# Patient Record
Sex: Female | Born: 1995 | Race: White | Hispanic: No | Marital: Single | Smoking: Never smoker
Health system: Southern US, Community
[De-identification: ages and names within clinical notes are randomized; demographics above are authoritative.]

## PROBLEM LIST (undated history)

## (undated) DIAGNOSIS — F419 Anxiety disorder, unspecified: Secondary | ICD-10-CM

## (undated) DIAGNOSIS — E739 Lactose intolerance, unspecified: Secondary | ICD-10-CM

## (undated) DIAGNOSIS — F329 Major depressive disorder, single episode, unspecified: Secondary | ICD-10-CM

## (undated) DIAGNOSIS — F32A Depression, unspecified: Secondary | ICD-10-CM

## (undated) HISTORY — DX: Major depressive disorder, single episode, unspecified: F32.9

## (undated) HISTORY — DX: Anxiety disorder, unspecified: F41.9

## (undated) HISTORY — DX: Depression, unspecified: F32.A

---
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
## 2016-02-28 ENCOUNTER — Emergency Department (HOSPITAL_COMMUNITY)
Admission: EM | Admit: 2016-02-28 | Discharge: 2016-02-28 | Disposition: A | Discharge status: Home / Self Care | Payer: BLUE CROSS/BLUE SHIELD | Attending: Emergency Medicine | Admitting: Emergency Medicine

## 2016-02-28 ENCOUNTER — Encounter (HOSPITAL_COMMUNITY): Payer: Self-pay | Admitting: *Deleted

## 2016-02-28 DIAGNOSIS — N898 Other specified noninflammatory disorders of vagina: Secondary | ICD-10-CM | POA: Diagnosis present

## 2016-02-28 DIAGNOSIS — N760 Acute vaginitis: Secondary | ICD-10-CM | POA: Diagnosis not present

## 2016-02-28 DIAGNOSIS — B9689 Other specified bacterial agents as the cause of diseases classified elsewhere: Secondary | ICD-10-CM

## 2016-02-28 LAB — URINALYSIS, COMPLETE (UACMP) WITH MICROSCOPIC
Bacteria, UA: NONE SEEN
Bilirubin Urine: NEGATIVE
Glucose, UA: NEGATIVE mg/dL
Ketones, ur: NEGATIVE mg/dL
Nitrite: NEGATIVE
Protein, ur: NEGATIVE mg/dL
Specific Gravity, Urine: 1.004 — ABNORMAL LOW (ref 1.005–1.030)
pH: 7 (ref 5.0–8.0)

## 2016-02-28 LAB — WET PREP, GENITAL
Sperm: NONE SEEN
Trich, Wet Prep: NONE SEEN
Yeast Wet Prep HPF POC: NONE SEEN

## 2016-02-28 LAB — PREGNANCY, URINE: Preg Test, Ur: NEGATIVE

## 2016-02-28 MED ORDER — METRONIDAZOLE 500 MG PO TABS: 500.0000 mg | ORAL_TABLET | Freq: Two times a day (BID) | ORAL | 0 refills | Status: DC

## 2016-02-28 NOTE — ED Triage Notes (Signed)
Pt reports stabbing lower abdominal pain and "whitish red" vaginal discharge for about 1 week. No meds PTA. Denies urinary symptoms or fevers.

## 2016-02-28 NOTE — ED Provider Notes (Signed)
[facility]-EMERGENCY DEPT Provider Note   CSN: [id number] Arrival date & time: 02/28/16  1709  By signing my name below, I, Rosario Adie, attest that this documentation has been prepared under the direction and in the presence of Raeford Razor, MD. Electronically Signed: Rosario Adie, ED Scribe. 02/28/16. 5:26 PM.  History   Chief Complaint Chief Complaint  Patient presents with  . Vaginal Discharge   The history is provided by the patient. No language interpreter was used.    HPI Comments: Sheila Bradley is a 21 y.o. female with no pertinent PMHx, who presents to the Emergency Department complaining of intermittent, sharp suprapubic abdominal pain beginning two weeks ago. No pain while in the ED. She notes associated whitish-bloody vaginal discharge with an abnormal odor, lower back pain, and intermittent dysuria as well. Pt has not noticed any precipitating or exacerbating factors to her pain. No treatments for her symptoms were tried prior to coming into the ED. She is currently sexually active, and her last STD screening was benign. LNMP: ~1 month ago, and she is currently on once every three month injections for birth control. She has a PSHx to the abdomen including an appendectomy. Pt denies fever, vaginal pain, frequency, urgency, nausea, vomiting, or any other associated symptoms.   History reviewed. No pertinent past medical history.  There are no active problems to display for this patient.  Past Surgical History:  Procedure Laterality Date  . APPENDECTOMY     OB History    No data available     Home Medications    Prior to Admission medications   Not on File   Family History No family history on file.  Social History Social History  Substance Use Topics  . Smoking status: Never Smoker  . Smokeless tobacco: Never Used  . Alcohol use Yes   Allergies   Patient has no known allergies.  Review of Systems Review of Systems  Constitutional: Negative  for fever.  Gastrointestinal: Positive for abdominal pain. Negative for nausea and vomiting.  Genitourinary: Positive for dysuria, vaginal bleeding and vaginal discharge.  Musculoskeletal: Positive for back pain (lower).  All other systems reviewed and are negative.  Physical Exam Updated Vital Signs BP 134/83   Pulse 70   Temp 98.3 F (36.8 C)   Resp 16   LMP 01/28/2016 (Approximate)   SpO2 100%   Physical Exam  Constitutional: She appears well-developed and well-nourished.  HENT:  Head: Normocephalic.  Right Ear: External ear normal.  Left Ear: External ear normal.  Nose: Nose normal.  Mouth/Throat: Oropharynx is clear and moist.  Eyes: Conjunctivae are normal. Right eye exhibits no discharge. Left eye exhibits no discharge.  Neck: Normal range of motion.  Cardiovascular: Normal rate, regular rhythm and normal heart sounds.   No murmur heard. Pulmonary/Chest: Effort normal and breath sounds normal. No respiratory distress. She has no wheezes. She has no rales.  Abdominal: Soft. She exhibits no distension. There is tenderness. There is no rebound and no guarding.  Minimal suprapubic tenderness.  Genitourinary:  Genitourinary Comments: Chaperone present throughout entire exam. Normal external female genitalia. No concerning lesions noted. Mild to moderate thin white/grey discharge. Cervix normal in appearance.   Musculoskeletal: Normal range of motion. She exhibits no edema or tenderness.  Neurological: She is alert. No cranial nerve deficit. Coordination normal.  Skin: Skin is warm and dry. No rash noted. No erythema. No pallor.  Psychiatric: She has a normal mood and affect. Her behavior is normal.  Nursing  note and vitals reviewed.  ED Treatments / Results  DIAGNOSTIC STUDIES: Oxygen Saturation is 100% on RA, normal by my interpretation.   COORDINATION OF CARE: 5:22 PM-Discussed next steps with pt. Pt verbalized understanding and is agreeable with the plan.    Labs (all labs ordered are listed, but only abnormal results are displayed) Labs Reviewed - No data to display  EKG  EKG Interpretation None      Radiology No results found.  Procedures Procedures   Medications Ordered in ED Medications - No data to display  Initial Impression / Assessment and Plan / ED Course  I have reviewed the triage vital signs and the nursing notes.  Pertinent labs & imaging results that were available during my care of the patient were reviewed by me and considered in my medical decision making (see chart for details).      Final Clinical Impressions(s) / ED Diagnoses   Final diagnoses:  BV (bacterial vaginosis)   New Prescriptions New Prescriptions   No medications on file   I personally preformed the services scribed in my presence. The recorded information has been reviewed is accurate. Raeford RazorStephen Avrohom Mckelvin, MD.     Raeford RazorStephen Yasmin Dibello, MD 03/13/16 [id number]

## 2016-02-29 LAB — GC/CHLAMYDIA PROBE AMP (~~LOC~~) NOT AT ARMC
Chlamydia: NEGATIVE
Neisseria Gonorrhea: NEGATIVE

## 2016-02-29 LAB — RPR: RPR Ser Ql: NONREACTIVE

## 2016-04-08 ENCOUNTER — Emergency Department (HOSPITAL_COMMUNITY)
Admission: EM | Admit: 2016-04-08 | Discharge: 2016-04-08 | Disposition: A | Discharge status: Home / Self Care | Payer: BLUE CROSS/BLUE SHIELD | Attending: Emergency Medicine | Admitting: Emergency Medicine

## 2016-04-08 ENCOUNTER — Emergency Department (HOSPITAL_COMMUNITY): Payer: BLUE CROSS/BLUE SHIELD

## 2016-04-08 ENCOUNTER — Encounter: Payer: Self-pay | Admitting: Emergency Medicine

## 2016-04-08 DIAGNOSIS — M546 Pain in thoracic spine: Secondary | ICD-10-CM | POA: Diagnosis present

## 2016-04-08 DIAGNOSIS — Y939 Activity, unspecified: Secondary | ICD-10-CM | POA: Insufficient documentation

## 2016-04-08 DIAGNOSIS — Z79899 Other long term (current) drug therapy: Secondary | ICD-10-CM | POA: Diagnosis not present

## 2016-04-08 DIAGNOSIS — W208XXA Other cause of strike by thrown, projected or falling object, initial encounter: Secondary | ICD-10-CM | POA: Diagnosis not present

## 2016-04-08 DIAGNOSIS — Y999 Unspecified external cause status: Secondary | ICD-10-CM | POA: Diagnosis not present

## 2016-04-08 DIAGNOSIS — Y929 Unspecified place or not applicable: Secondary | ICD-10-CM | POA: Diagnosis not present

## 2016-04-08 MED ORDER — NAPROXEN 500 MG PO TABS: 500.0000 mg | ORAL_TABLET | Freq: Two times a day (BID) | ORAL | 0 refills | Status: DC

## 2016-04-08 MED ORDER — HYDROCODONE-ACETAMINOPHEN 5-325 MG PO TABS
1.0000 | ORAL_TABLET | Freq: Once | ORAL | Status: AC
  Administered 2016-04-08: 1 via ORAL
  Filled 2016-04-08: qty 1

## 2016-04-08 MED ORDER — METHOCARBAMOL 500 MG PO TABS: 500.0000 mg | ORAL_TABLET | Freq: Every evening | ORAL | 0 refills | Status: AC | PRN

## 2016-04-08 NOTE — ED Triage Notes (Signed)
Pt complains of upper back pain that makes breathing difficult since a clothing hamper fell on her upper back. Pt states she initially had tingling in her right arm that is no longer present.

## 2016-04-08 NOTE — Discharge Instructions (Signed)
Naproxen for pain. Follow up with your primary care provider. Return to the ED if symptoms worsen or you experience any new concerning symptoms.

## 2016-04-08 NOTE — ED Provider Notes (Signed)
[facility]-EMERGENCY DEPT Provider Note   CSN: [id number] Arrival date & time: 04/08/16  1609   By signing my name below, I, Sheila Bradley, attest that this documentation has been prepared under the direction and in the presence of Mathews Robinsons, PA-C Electronically Signed: Soijett Bradley, ED Scribe. 04/08/16. 6:21 PM.  History   Chief Complaint Chief Complaint  Patient presents with  . Back Pain    HPI Sheila Bradley is a 21 y.o. female who presents to the Emergency Department complaining of mid back pain onset last night. Pt reports associated resolved right arm tingling. Pt has tried ibuprofen with her last dose being at 2 PM with no relief of her symptoms. She reports that a full clothing hamper fell from an elevated position onto her mid back. Pt states that her mid back pain is worsened with deep breathing, laying on her stomach, and tilting her head forward. Pt denies color change, wound, fever, chills, and any other symptoms. 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     The history is provided by the patient. No language interpreter was used.    No past medical history on file.  There are no active problems to display for this patient.   Past Surgical History:  Procedure Laterality Date  . APPENDECTOMY      OB History    No data available       Home Medications    Prior to Admission medications   Medication Sig Start Date End Date Taking? Authorizing Provider  ASHLYNA 0.15-0.03 &0.01 MG tablet Take 1 tablet by mouth daily. 01/15/16   Historical Provider, MD  ibuprofen (ADVIL,MOTRIN) 200 MG tablet Take 600 mg by mouth every 6 (six) hours as needed.    Historical Provider, MD  methocarbamol (ROBAXIN) 500 MG tablet Take 1 tablet (500 mg total) by mouth at bedtime as needed for muscle spasms. 04/08/16   Georgiana Shore, PA-C  metroNIDAZOLE (FLAGYL) 500 MG tablet Take 1 tablet (500 mg total) by mouth 2 (two) times daily. 02/28/16    Raeford Razor, MD  naproxen (NAPROSYN) 500 MG tablet Take 1 tablet (500 mg total) by mouth 2 (two) times daily with a meal. 04/08/16   Georgiana Shore, PA-C    Family History No family history on file.  Social History Social History  Substance Use Topics  . Smoking status: Never Smoker  . Smokeless tobacco: Never Used  . Alcohol use Yes     Allergies   Patient has no known allergies.   Review of Systems Review of Systems  Constitutional: Negative for chills and fever.  Respiratory: Negative for chest tightness, shortness of breath, wheezing and stridor.   Cardiovascular: Negative for chest pain, palpitations and leg swelling.  Gastrointestinal: Negative for abdominal pain, nausea and vomiting.  Musculoskeletal: Positive for back pain (mid). Negative for gait problem, neck pain and neck stiffness.  Skin: Negative for color change, pallor, rash and wound.  Neurological: Negative for dizziness, syncope, weakness, light-headedness, numbness and headaches.       +Resolved right arm tingling     Physical Exam Updated Vital Signs BP 125/90 (BP Location: Left Arm)   Pulse 80   Temp 97.8 F (36.6 C) (Oral)   Resp 18   Wt 58.5 kg   LMP 04/08/2016   SpO2 100%   Physical Exam  Constitutional: She is oriented to person, place, and time. She appears well-developed and well-nourished. No distress.  Patient is afebrile, non-toxic  appearing, seating comfortably in chair in no acute distress.  HENT:  Head: Normocephalic and atraumatic.  Eyes: EOM are normal.  Neck: Neck supple.  Cardiovascular: Normal rate, regular rhythm and normal heart sounds.  Exam reveals no gallop and no friction rub.   No murmur heard. Pulmonary/Chest: Effort normal and breath sounds normal. No respiratory distress. She has no wheezes. She has no rales.  Good air exchange.  Abdominal: She exhibits no distension.  Musculoskeletal: Normal range of motion.       Cervical back: She exhibits no tenderness  and no bony tenderness.       Thoracic back: She exhibits bony tenderness.       Lumbar back: She exhibits no tenderness and no bony tenderness.  Midline tenderness of thoracic spine. No midline cervical or lumbar tenderness.  Neurological: She is alert and oriented to person, place, and time.  Skin: Skin is warm and dry.  Psychiatric: She has a normal mood and affect. Her behavior is normal.  Nursing note and vitals reviewed.    ED Treatments / Results  DIAGNOSTIC STUDIES: Oxygen Saturation is 100% on RA, nl by my interpretation.    COORDINATION OF CARE: 6:18 PM Discussed treatment plan with pt at bedside which includes thoracic spine xray, norco, ice, and pt agreed to plan.   Radiology Dg Thoracic Spine 2 View  Result Date: 04/08/2016 CLINICAL DATA:  Back pain after a blunt object fell on the patient. EXAM: THORACIC SPINE 2 VIEWS COMPARISON:  None. FINDINGS: There is no evidence of thoracic spine fracture. Alignment is normal. No other significant bone abnormalities are identified. IMPRESSION: Negative. Electronically Signed   By: Elsie Stain M.D.   On: 04/08/2016 18:50    Procedures Procedures (including critical care time)  Medications Ordered in ED Medications  HYDROcodone-acetaminophen (NORCO/VICODIN) 5-325 MG per tablet 1 tablet (1 tablet Oral Given 04/08/16 1822)     Initial Impression / Assessment and Plan / ED Course  I have reviewed the triage vital signs and the nursing notes.  Pertinent imaging results that were available during my care of the patient were reviewed by me and considered in my medical decision making (see chart for details).     Patient with back pain after injury from hamper falling on her back. No neurological deficits and normal gross neuro exam. Patient is ambulatory. No loss of bowel or bladder control. No concern for cauda equina. No fever, night sweats, weight loss, h/o cancer, IVDA, no recent procedure to back. No urinary symptoms  suggestive of UTI. Xray negative for obvious fracture or dislocation. Supportive care and return precaution discussed. Appears safe for discharge at this time. Follow up as indicated in discharge paperwork.   Discussed strict return precautions and advised to return to the emergency department if experiencing any new or worsening symptoms. Instructions were understood and patient agreed with discharge plan.  Final Clinical Impressions(s) / ED Diagnoses   Final diagnoses:  Acute bilateral thoracic back pain    New Prescriptions New Prescriptions   METHOCARBAMOL (ROBAXIN) 500 MG TABLET    Take 1 tablet (500 mg total) by mouth at bedtime as needed for muscle spasms.   NAPROXEN (NAPROSYN) 500 MG TABLET    Take 1 tablet (500 mg total) by mouth 2 (two) times daily with a meal.   I personally performed the services described in this documentation, which was scribed in my presence. The recorded information has been reviewed and is accurate.     Georgiana Shore,  PA-C 04/08/16 1926    Lavera Guiseana Duo Liu, MD 04/11/16 [phone]

## 2016-10-01 ENCOUNTER — Emergency Department (HOSPITAL_COMMUNITY): Payer: Commercial Managed Care - PPO

## 2016-10-01 ENCOUNTER — Emergency Department (HOSPITAL_COMMUNITY)
Admission: EM | Admit: 2016-10-01 | Discharge: 2016-10-01 | Disposition: A | Discharge status: Home / Self Care | Payer: Commercial Managed Care - PPO | Attending: Emergency Medicine | Admitting: Emergency Medicine

## 2016-10-01 ENCOUNTER — Encounter (HOSPITAL_COMMUNITY): Payer: Self-pay | Admitting: Emergency Medicine

## 2016-10-01 DIAGNOSIS — K5900 Constipation, unspecified: Secondary | ICD-10-CM | POA: Diagnosis not present

## 2016-10-01 DIAGNOSIS — R1032 Left lower quadrant pain: Secondary | ICD-10-CM | POA: Diagnosis not present

## 2016-10-01 DIAGNOSIS — R109 Unspecified abdominal pain: Secondary | ICD-10-CM | POA: Diagnosis present

## 2016-10-01 DIAGNOSIS — Z79899 Other long term (current) drug therapy: Secondary | ICD-10-CM | POA: Diagnosis not present

## 2016-10-01 HISTORY — DX: Lactose intolerance, unspecified: E73.9

## 2016-10-01 LAB — URINALYSIS, ROUTINE W REFLEX MICROSCOPIC
BACTERIA UA: NONE SEEN
Bilirubin Urine: NEGATIVE
GLUCOSE, UA: NEGATIVE mg/dL
KETONES UR: NEGATIVE mg/dL
NITRITE: NEGATIVE
PROTEIN: NEGATIVE mg/dL
Specific Gravity, Urine: 1.043 — ABNORMAL HIGH (ref 1.005–1.030)
pH: 5 (ref 5.0–8.0)

## 2016-10-01 LAB — CBC
HEMATOCRIT: 38.9 % (ref 36.0–46.0)
HEMOGLOBIN: 13.2 g/dL (ref 12.0–15.0)
MCH: 30.8 pg (ref 26.0–34.0)
MCHC: 33.9 g/dL (ref 30.0–36.0)
MCV: 90.9 fL (ref 78.0–100.0)
Platelets: 277 10*3/uL (ref 150–400)
RBC: 4.28 MIL/uL (ref 3.87–5.11)
RDW: 13.3 % (ref 11.5–15.5)
WBC: 10.5 10*3/uL (ref 4.0–10.5)

## 2016-10-01 LAB — LIPASE, BLOOD: LIPASE: 32 U/L (ref 11–51)

## 2016-10-01 LAB — COMPREHENSIVE METABOLIC PANEL
ALBUMIN: 4.4 g/dL (ref 3.5–5.0)
ALT: 28 U/L (ref 14–54)
AST: 23 U/L (ref 15–41)
Alkaline Phosphatase: 44 U/L (ref 38–126)
Anion gap: 6 (ref 5–15)
BUN: 5 mg/dL — ABNORMAL LOW (ref 6–20)
CALCIUM: 9.5 mg/dL (ref 8.9–10.3)
CHLORIDE: 107 mmol/L (ref 101–111)
CO2: 25 mmol/L (ref 22–32)
Creatinine, Ser: 1.02 mg/dL — ABNORMAL HIGH (ref 0.44–1.00)
GFR calc non Af Amer: >60 mL/min (ref >60)
GLUCOSE: 93 mg/dL (ref 65–99)
POTASSIUM: 4.2 mmol/L (ref 3.5–5.1)
SODIUM: 138 mmol/L (ref 135–145)
Total Bilirubin: 0.6 mg/dL (ref 0.3–1.2)
Total Protein: 7.4 g/dL (ref 6.5–8.1)

## 2016-10-01 LAB — I-STAT BETA HCG BLOOD, ED (MC, WL, AP ONLY)

## 2016-10-01 MED ORDER — DICYCLOMINE HCL 10 MG/ML IM SOLN
20.0000 mg | Freq: Once | INTRAMUSCULAR | Status: AC
  Administered 2016-10-01: 20 mg via INTRAMUSCULAR
  Filled 2016-10-01: qty 2

## 2016-10-01 MED ORDER — BISACODYL 5 MG PO TBEC: 5.0000 mg | DELAYED_RELEASE_TABLET | Freq: Every day | ORAL | 0 refills | Status: AC

## 2016-10-01 MED ORDER — SODIUM CHLORIDE 0.9 % IV BOLUS (SEPSIS)
1000.0000 mL | Freq: Once | INTRAVENOUS | Status: AC
  Administered 2016-10-01: 1000 mL via INTRAVENOUS

## 2016-10-01 MED ORDER — IOPAMIDOL (ISOVUE-300) INJECTION 61%
INTRAVENOUS | Status: AC
  Administered 2016-10-01: 18:00:00
  Filled 2016-10-01: qty 100

## 2016-10-01 MED ORDER — DOCUSATE SODIUM 250 MG PO CAPS: 250.0000 mg | ORAL_CAPSULE | Freq: Every day | ORAL | 0 refills | Status: AC

## 2016-10-01 NOTE — ED Triage Notes (Signed)
Pt c/o lower abdominal pain x 3 days. Denies n/v/diarrhea. Denies urinary symptoms, states she has had "off and on" vaginal discharge. LMP 09/16/16.

## 2016-10-01 NOTE — Discharge Instructions (Signed)
As discussed, your evaluation today has been largely reassuring.  But, it is important that you monitor your condition carefully, and do not hesitate to return to the ED if you develop new, or concerning changes in your condition. ? ?Otherwise, please follow-up with your physician for appropriate ongoing care. ? ?

## 2016-10-01 NOTE — ED Provider Notes (Signed)
[facility]-EMERGENCY DEPT Provider Note   CSN: [id number] Arrival date & time: 10/01/16  1446     History   Chief Complaint Chief Complaint  Patient presents with  . Abdominal Pain    HPI Sheila Bradley is a 21 y.o. female.  HPI  Patient presents with concern of abdominal pain. Patient states that she is generally well, aside from history of lactose intolerance which she self diagnosed. She notes that yesterday she has had focal pain persistently in the left lateral abdomen, with occasional bursts in the epigastric area. In general patient has had episodes of similar pain over the past few months, but nothing has sustained, severe and persistent. No relief with anything including OTC medication. There is associated anorexia, nausea, anxiousness There is some bloody stool. Patient states that she has a family members with GI illness, though she is unsure of the diagnoses. Patient is here with her boyfriend who corroborates history.  Past Medical History:  Diagnosis Date  . Lactose intolerance     There are no active problems to display for this patient.   Past Surgical History:  Procedure Laterality Date  . APPENDECTOMY      OB History    No data available       Home Medications    Prior to Admission medications   Medication Sig Start Date End Date Taking? Authorizing Provider  ASHLYNA 0.15-0.03 &0.01 MG tablet Take 1 tablet by mouth daily. 01/15/16   [provider]  ibuprofen (ADVIL,MOTRIN) 200 MG tablet Take 600 mg by mouth every 6 (six) hours as needed.    [provider]  methocarbamol (ROBAXIN) 500 MG tablet Take 1 tablet (500 mg total) by mouth at bedtime as needed for muscle spasms. 04/08/16   Georgiana Shore, PA-C  metroNIDAZOLE (FLAGYL) 500 MG tablet Take 1 tablet (500 mg total) by mouth 2 (two) times daily. 02/28/16   Raeford Razor, MD  naproxen (NAPROSYN) 500 MG tablet Take 1 tablet (500 mg total) by mouth 2 (two) times daily with a  meal. 04/08/16   Georgiana Shore, PA-C    Family History No family history on file.  Social History Social History  Substance Use Topics  . Smoking status: Never Smoker  . Smokeless tobacco: Never Used  . Alcohol use Yes     Allergies   Patient has no known allergies.   Review of Systems Review of Systems  Constitutional:       Per HPI, otherwise negative  HENT:       Per HPI, otherwise negative  Respiratory:       Per HPI, otherwise negative  Cardiovascular:       Per HPI, otherwise negative  Gastrointestinal: Positive for abdominal distention (yesterday), abdominal pain, blood in stool, nausea and rectal pain. Negative for vomiting.  Endocrine:       Negative aside from HPI  Genitourinary:       Neg aside from HPI   Musculoskeletal:       Per HPI, otherwise negative  Skin: Negative.   Neurological: Negative for syncope.     Physical Exam Updated Vital Signs BP 116/86 (BP Location: Left Arm)   Pulse 73   Temp 98.1 F (36.7 C) (Oral)   Resp 16   Ht  (1.575 m)   Wt 59 kg (130 lb)   LMP 09/16/2016   SpO2 100%   BMI 23.78 kg/m   Physical Exam  Constitutional: She is oriented to person, place, and time. She  appears well-developed and well-nourished. No distress.  HENT:  Head: Normocephalic and atraumatic.  Eyes: Conjunctivae and EOM are normal.  Cardiovascular: Normal rate and regular rhythm.   Pulmonary/Chest: Effort normal and breath sounds normal. No stridor. No respiratory distress.  Abdominal: She exhibits no distension. There is tenderness in the epigastric area, periumbilical area and left lower quadrant. There is guarding.  Musculoskeletal: She exhibits no edema.  Neurological: She is alert and oriented to person, place, and time. No cranial nerve deficit.  Skin: Skin is warm and dry.  Psychiatric: She has a normal mood and affect.  Nursing note and vitals reviewed.    ED Treatments / Results  Labs (all labs ordered are listed, but  only abnormal results are displayed) Labs Reviewed  COMPREHENSIVE METABOLIC PANEL - Abnormal; Notable for the following:       Result Value   BUN 5 (*)    Creatinine, Ser 1.02 (*)    All other components within normal limits  LIPASE, BLOOD  CBC  URINALYSIS, ROUTINE W REFLEX MICROSCOPIC  I-STAT BETA HCG BLOOD, ED ([facility], [facility], [facility] ONLY)     Radiology Ct Abdomen Pelvis W Contrast  Result Date: 10/01/2016 CLINICAL DATA:  Lower abdominal pain for 3 days. On and off vaginal discharge. EXAM: CT ABDOMEN AND PELVIS WITH CONTRAST TECHNIQUE: Multidetector CT imaging of the abdomen and pelvis was performed using the standard protocol following bolus administration of intravenous contrast. CONTRAST:  100 cc Isovue 300 intravenous. COMPARISON:  None. FINDINGS: Lower chest:  No contributory findings. Hepatobiliary: No focal liver abnormality.No evidence of biliary obstruction or stone. Pancreas: Unremarkable. Spleen: Unremarkable. Adrenals/Urinary Tract: Negative adrenals. No hydronephrosis or stone. Tiny lower pole cyst on the right, simple appearing on reformats. Unremarkable bladder. Stomach/Bowel: Bowel loops are difficult to separate from one another about the cecum; patient has history of appendectomy per the EMR. No bowel inflammatory changes. Formed stool throughout the colon, possible constipation. Vascular/Lymphatic: No vascular abnormality.  No mass or adenopathy. Reproductive:No pathologic findings. Other: No ascites or pneumoperitoneum. Musculoskeletal: No acute abnormalities. IMPRESSION: 1. No acute finding. 2. Formed stool throughout the colon, please correlate for constipation. Electronically Signed   By: Marnee Spring M.D.   On: 10/01/2016 18:43    Procedures Procedures (including critical care time)  Medications Ordered in ED Medications  sodium chloride 0.9 % bolus 1,000 mL (0 mLs Intravenous Stopped 10/01/16 1918)  dicyclomine (BENTYL) injection 20 mg (20 mg Intramuscular Given 10/01/16  1729)  iopamidol (ISOVUE-300) 61 % injection (  Contrast Given 10/01/16 1800)     Initial Impression / Assessment and Plan / ED Course  I have reviewed the triage vital signs and the nursing notes.  Pertinent labs & imaging results that were available during my care of the patient were reviewed by me and considered in my medical decision making (see chart for details).  7:52 PM Patient states that she feels substantially better after receiving IV fluids, Bentyl. I demonstrated the CT images to the patient and her boyfriend. We reviewed the implications of stool burden, particularly given her discussion of worsening persistent pain.  Patient confirms that she has no urinary complaints she has had urinary tract infection the past, and this would be unusual for her to have infection without symptoms. We discussed the initiation of a new BM regimen, following up with GI, particularly given the patient's acknowledgment of food intolerance, family history of IBS-like symptoms.  Final Clinical Impressions(s) / ED Diagnoses  Abdominal pain   Gerhard Munch,  MD 10/01/16 2000

## 2016-10-01 NOTE — ED Notes (Signed)
Pt aware of need for urine sample.  Will try after fluid bolus.

## 2016-10-01 NOTE — ED Notes (Signed)
Pt back from CT

## 2016-10-01 NOTE — ED Notes (Signed)
Pt ambulatory to [facility] for urine sample

## 2016-10-03 ENCOUNTER — Telehealth: Payer: Self-pay | Admitting: Internal Medicine

## 2016-10-03 NOTE — Telephone Encounter (Signed)
Patient will come in and see Hyacinth Meeker, [place] tomorrow at 2:15

## 2016-10-04 ENCOUNTER — Encounter: Payer: Self-pay | Admitting: Physician Assistant

## 2016-10-04 ENCOUNTER — Other Ambulatory Visit (INDEPENDENT_AMBULATORY_CARE_PROVIDER_SITE_OTHER): Payer: Commercial Managed Care - PPO

## 2016-10-04 ENCOUNTER — Ambulatory Visit (INDEPENDENT_AMBULATORY_CARE_PROVIDER_SITE_OTHER): Payer: Commercial Managed Care - PPO | Admitting: Physician Assistant

## 2016-10-04 VITALS — BP 122/68 | HR 58 | Ht 62.0 in | Wt 127.1 lb

## 2016-10-04 DIAGNOSIS — K581 Irritable bowel syndrome with constipation: Secondary | ICD-10-CM | POA: Diagnosis not present

## 2016-10-04 DIAGNOSIS — R1032 Left lower quadrant pain: Secondary | ICD-10-CM

## 2016-10-04 DIAGNOSIS — K5900 Constipation, unspecified: Secondary | ICD-10-CM

## 2016-10-04 DIAGNOSIS — R1013 Epigastric pain: Secondary | ICD-10-CM

## 2016-10-04 LAB — IGA: IgA: 161 mg/dL (ref 68–378)

## 2016-10-04 MED ORDER — HYOSCYAMINE SULFATE 0.125 MG SL SUBL: 0.1250 mg | SUBLINGUAL_TABLET | SUBLINGUAL | 0 refills | Status: AC | PRN

## 2016-10-04 MED ORDER — OMEPRAZOLE 20 MG PO CPDR: 20.0000 mg | DELAYED_RELEASE_CAPSULE | Freq: Every day | ORAL | 3 refills | Status: AC

## 2016-10-04 NOTE — Patient Instructions (Addendum)
We have sent the following medications to your pharmacy for you to pick up at your convenience: Omeprazole 20 mg daily 30-60 mins before breakfast  Hyoscyamine 0.125 mg every 4-6 hrs as needed  Please purchase the following medications over the counter and take as directed:  Polyethylene Glycol (miralax) 17 g daily   Your provider suggest that you drink more water. Try to have at least 6-8 8 oz glasses of water daily.   We have given you a high fiber diet handout. Please strive to have 25-30 grams of fiber daily.

## 2016-10-04 NOTE — Progress Notes (Signed)
Agree with assessment and plan as outlined.  

## 2016-10-04 NOTE — Progress Notes (Signed)
Chief Complaint: Abdominal pain, constipation  HPI:  Sheila Bradley is a 21 year old Caucasian female with a past medical history of anxiety and depression,  who was referred to me from the ED, for a complaint of abdominal pain and constipation.   Patient was seen in the ED 10/01/16 for a complaint of abdominal pain. At that time, she described a history of lactose intolerance which she had self diagnosed as well as focal pain persistently in the left lateral abdomen with occasional bursts into the epigastric area. Labs at that time including a CMP, lipase, CBC, urinalysis and hCG were all negative/normal. CT of the abdomen and pelvis showed no acute finding. There was formed stool throughout the colon. Patient was placed on a bowel regimen. She did have improvement after IV fluids and Bentyl.   Today, the patient presents to clinic today and describes that she has a family history of "GI illness". She describes that her sister has celiac disease and her father has IBS. He was also diagnosed with precancerous polyps in his late 83s. The patient notes that since being in the ER she has continued with problems. Over the past 3 months she has had trouble with constipation which is almost constant. She notes a bowel movement maybe once a week. This has not improved with a once a day Dulcolax and once a day Colace which she has been using since ER visit. She denies any blood in her stool or black tarry sticky stools. Associated symptoms do include left lower quadrant pain which sometimes "shoots into my epigastrium". Sometimes these pains keep her awake at night and out of school on occasion.   Patient describes waking on a daily basis and will either have pain in her epigastrium or her left lower quadrant or sometimes both. Lately patient has been unable to eat due to the epigastric pain immediately after swallowing. Eating tends to make this pain worse and it does not seem to matter what. She has not had a recent  change in her diet and denies any recent antibiotics or changes in medication. Patient also seems to bloat every other day.   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  Past medical history is positive for being on an anti-anxiety medication a little while ago but the patient describes feeling like a "zombie" and stopping this medication.   Patient denies fever, chills, weight loss, anorexia, nausea, vomiting, heartburn, reflux or increased gas.  Past Medical History:  Diagnosis Date  . Anxiety   . Depression   . Lactose intolerance     Past Surgical History:  Procedure Laterality Date  . APPENDECTOMY  2009    Current Outpatient Prescriptions  Medication Sig Dispense Refill  . ASHLYNA 0.15-0.03 &0.01 MG tablet Take 1 tablet by mouth daily.  0  . bisacodyl (DULCOLAX) 5 MG EC tablet Take 1 tablet (5 mg total) by mouth daily. 10 tablet 0  . docusate sodium (COLACE) 250 MG capsule Take 1 capsule (250 mg total) by mouth daily. 10 capsule 0  . ibuprofen (ADVIL,MOTRIN) 200 MG tablet Take 600 mg by mouth every 6 (six) hours as needed.    . methocarbamol (ROBAXIN) 500 MG tablet Take 1 tablet (500 mg  total) by mouth at bedtime as needed for muscle spasms. 10 tablet 0  . hyoscyamine (LEVSIN SL) 0.125 MG SL tablet Place 1 tablet (0.125 mg total) under the tongue every 4 (four) hours as needed. 30 tablet 0  . omeprazole (PRILOSEC) 20 MG capsule Take 1 capsule (20 mg total) by mouth daily. 90 capsule 3   No current facility-administered medications for this visit.     Allergies as of 10/04/2016  . (No Known Allergies)    Family History  Problem Relation Age of Onset  . Irritable bowel syndrome Father   . Colon polyps Father   . Celiac disease  Sister     Social History   Social History  . Marital status: Single    Spouse name: N/A  . Number of children: N/A  . Years of education: N/A   Occupational History  . student    Social History Main Topics  . Smoking status: Never Smoker  . Smokeless tobacco: Never Used  . Alcohol use Yes  . Drug use: No  . Sexual activity: Not on file   Other Topics Concern  . Not on file   Social History Narrative  . No narrative on file    Review of Systems:    Constitutional: No weight loss, fever or chills Skin: No rash  Cardiovascular: No chest pain  Respiratory: No SOB  Gastrointestinal: See HPI and otherwise negative Genitourinary: No dysuria  Neurological: No headache Musculoskeletal: No new muscle or joint pain Hematologic: No bleeding Psychiatric: History of anxiety   Physical Exam:  Vital signs: BP 122/68   Pulse (!) 58   Ht  (1.575 m)   Wt 127 lb 2 oz (57.7 kg)   LMP 09/16/2016   BMI 23.25 kg/m   Constitutional:   Pleasant Caucasian female appears to be in NAD, Well developed, Well nourished, alert and cooperative Head:  Normocephalic and atraumatic. Eyes:   PEERL, EOMI. No icterus. Conjunctiva pink. Ears:  Normal auditory acuity. Neck:  Supple Throat: Oral cavity and pharynx without inflammation, swelling or lesion.  Respiratory: Respirations even and unlabored. Lungs clear to auscultation bilaterally.   No wheezes, crackles, or rhonchi.  Cardiovascular: Normal S1, S2. No MRG. Regular rate and rhythm. No peripheral edema, cyanosis or pallor.  Gastrointestinal:  Soft, nondistended, mild epigastric and LLQ pain No rebound or guarding. Normal bowel sounds. No appreciable masses or hepatomegaly. Rectal:  Not performed.  Msk:  Symmetrical without gross deformities. Without edema, no deformity or joint abnormality.  Neurologic:  Alert and  oriented x4;  grossly normal neurologically.  Skin:   Dry and intact without significant lesions or  rashes. Psychiatric: Demonstrates good judgement and reason without abnormal affect or behaviors.  MOST RECENT LABS AND IMAGING: CBC    Component Value Date/Time   WBC 10.5 10/01/2016 1524   RBC 4.28 10/01/2016 1524   HGB 13.2 10/01/2016 1524   HCT 38.9 10/01/2016 1524   PLT 277 10/01/2016 1524   MCV 90.9 10/01/2016 1524   MCH 30.8 10/01/2016 1524   MCHC 33.9 10/01/2016 1524   RDW 13.3 10/01/2016 1524    CMP     Component Value Date/Time   NA 138 10/01/2016 1524   K 4.2 10/01/2016 1524   CL 107 10/01/2016 1524   CO2 25 10/01/2016 1524   GLUCOSE 93 10/01/2016 1524   BUN 5 (L) 10/01/2016 1524   CREATININE 1.02 (H) 10/01/2016 1524   CALCIUM 9.5 10/01/2016 1524   PROT 7.4 10/01/2016 1524  ALBUMIN 4.4 10/01/2016 1524   AST 23 10/01/2016 1524   ALT 28 10/01/2016 1524   ALKPHOS 44 10/01/2016 1524   BILITOT 0.6 10/01/2016 1524   GFRNONAA >60 10/01/2016 1524   GFRAA >60 10/01/2016 1524   Ct Abdomen Pelvis W Contrast  Result Date: 10/01/2016 CLINICAL DATA:  Lower abdominal pain for 3 days. On and off vaginal discharge. EXAM: CT ABDOMEN AND PELVIS WITH CONTRAST TECHNIQUE: Multidetector CT imaging of the abdomen and pelvis was performed using the standard protocol following bolus administration of intravenous contrast. CONTRAST:  100 cc Isovue 300 intravenous. COMPARISON:  None. FINDINGS: Lower chest:  No contributory findings. Hepatobiliary: No focal liver abnormality.No evidence of biliary obstruction or stone. Pancreas: Unremarkable. Spleen: Unremarkable. Adrenals/Urinary Tract: Negative adrenals. No hydronephrosis or stone. Tiny lower pole cyst on the right, simple appearing on reformats. Unremarkable bladder. Stomach/Bowel: Bowel loops are difficult to separate from one another about the cecum; patient has history of appendectomy per the EMR. No bowel inflammatory changes. Formed stool throughout the colon, possible constipation. Vascular/Lymphatic: No vascular abnormality.  No  mass or adenopathy. Reproductive:No pathologic findings. Other: No ascites or pneumoperitoneum. Musculoskeletal: No acute abnormalities. IMPRESSION: 1. No acute finding. 2. Formed stool throughout the colon, please correlate for constipation. Electronically Signed   By: Marnee Spring M.D.   On: 10/01/2016 18:43     Assessment: 1. Constipation: Likely with IBS 2. Epigastric pain: Concern for gastritis versus functional dyspepsia 3. Left lower quadrant pain: Likely with IBS and constipation  Plan: 1. Discussed with the patient that her symptoms sound as though they are related to IBS and an increase in stress and anxiety in her life recently with clinical rotations, lack of sleep and lack of exercise. 2. Encouraged patient to find ways to reduce stress in her life even if it is just going for 20 minute walk per day or other. 3. Prescribe polyethylene glycol. Discussed with the patient that she should use one packet per day and may take this up to 4 times a day as necessary for constipation. 4. Ordered labs for celiac testing including total Iga and tt IgA 5. Recommend the patient continue her increased water intake to at least 6-8 8 ounce glasses per day 6. Prescribed Omeprazole 40 mg once daily, 30-60 minutes before breakfast. 7. Prescribed Hyoscyamine sulfate 0.125 mg every 4-6 hours for abdominal cramping. Did discuss that if the patient uses on a regular basis it may cause constipation. 8. Patient to return to clinic in 3-4 weeks with me, if no improvement can discuss further testing, probiotic, low-FODMAP diet, etc.  Sheila Meeker, PA-C [facility] 10/04/2016, 3:47 PM

## 2016-10-05 LAB — TISSUE TRANSGLUTAMINASE, IGA: (tTG) Ab, IgA: 1 U/mL

## 2016-11-14 ENCOUNTER — Emergency Department (HOSPITAL_COMMUNITY)
Admission: EM | Admit: 2016-11-14 | Discharge: 2016-11-14 | Disposition: A | Discharge status: Home / Self Care | Payer: Commercial Managed Care - PPO | Attending: Emergency Medicine | Admitting: Emergency Medicine

## 2016-11-14 ENCOUNTER — Encounter (HOSPITAL_COMMUNITY): Payer: Self-pay

## 2016-11-14 DIAGNOSIS — N7689 Other specified inflammation of vagina and vulva: Secondary | ICD-10-CM | POA: Diagnosis present

## 2016-11-14 DIAGNOSIS — T7840XA Allergy, unspecified, initial encounter: Secondary | ICD-10-CM | POA: Insufficient documentation

## 2016-11-14 DIAGNOSIS — B359 Dermatophytosis, unspecified: Secondary | ICD-10-CM | POA: Insufficient documentation

## 2016-11-14 MED ORDER — FAMOTIDINE 20 MG PO TABS
40.0000 mg | ORAL_TABLET | Freq: Once | ORAL | Status: AC
  Administered 2016-11-14: 40 mg via ORAL
  Filled 2016-11-14: qty 2

## 2016-11-14 MED ORDER — PREDNISONE 20 MG PO TABS
40.0000 mg | ORAL_TABLET | Freq: Once | ORAL | Status: AC
Start: 2016-11-14 — End: 2016-11-14
  Administered 2016-11-14: 40 mg via ORAL
  Filled 2016-11-14: qty 2

## 2016-11-14 MED ORDER — PREDNISONE 20 MG PO TABS
60.0000 mg | ORAL_TABLET | Freq: Once | ORAL | Status: DC
Start: 2016-11-14 — End: 2016-11-14

## 2016-11-14 MED ORDER — PREDNISONE 20 MG PO TABS: 40.0000 mg | ORAL_TABLET | Freq: Every day | ORAL | 0 refills | Status: AC

## 2016-11-14 MED ORDER — CLOTRIMAZOLE 1 % EX CREA: TOPICAL_CREAM | CUTANEOUS | 0 refills | Status: AC

## 2016-11-14 MED ORDER — FAMOTIDINE 20 MG PO TABS: 20.0000 mg | ORAL_TABLET | Freq: Two times a day (BID) | ORAL | 0 refills | Status: AC | PRN

## 2016-11-14 NOTE — ED Notes (Signed)
Patient is the room getting undress family is at bedside

## 2016-11-14 NOTE — ED Provider Notes (Signed)
[facility] EMERGENCY DEPARTMENT Provider Note   CSN: [id number] Arrival date & time: 11/14/16  4098     History   Chief Complaint Chief Complaint  Patient presents with  . Groin Swelling    HPI Shia Delaine is a 21 y.o. female.  The history is provided by the patient and medical records. No language interpreter was used.   Cheray Pardi is a 21 y.o. female  with a PMH of anxiety, depression who presents to the Emergency Department complaining of swelling to vaginal area which began this morning. Patient states that she initially felt itching down below. About 5 minutes later, she felt swelling to the area. Initially just swollen to the left side, but since arrival to ED, right side and clitoris are now swollen as well. No intercourse in the last week. No sex toys. No vaginal discharge or urinary symptoms. Patient states that she has been house-sitting for someone and did use their luffa to this area last night and has been using there soap for the last 2-3 days. No other known triggers for swelling. No oral lesions or swelling. No cough, sore throat or trouble breathing. No medications taken prior to arrival for symptoms.   Patient additionally complains of lesion to right upper arm which she believes is a fungal infection. It has been present for two weeks now. Initially mildly pruritic, but now with no symptoms. No known sick contacts. No insect bites known. No recent camping or travel.   Past Medical History:  Diagnosis Date  . Anxiety   . Depression   . Lactose intolerance     There are no active problems to display for this patient.   Past Surgical History:  Procedure Laterality Date  . APPENDECTOMY  2009    OB History    No data available       Home Medications    Prior to Admission medications   Medication Sig Start Date End Date Taking? Authorizing Provider  ASHLYNA 0.15-0.03 &0.01 MG tablet Take 1 tablet by mouth daily. 01/15/16    [provider]  bisacodyl (DULCOLAX) 5 MG EC tablet Take 1 tablet (5 mg total) by mouth daily. 10/01/16   Gerhard Munch, MD  clotrimazole (LOTRIMIN) 1 % cream Apply to affected area on arm 2 times daily 11/14/16   Monie Shere, Chase Picket, PA-C  docusate sodium (COLACE) 250 MG capsule Take 1 capsule (250 mg total) by mouth daily. 10/01/16   Gerhard Munch, MD  famotidine (PEPCID) 20 MG tablet Take 1 tablet (20 mg total) by mouth 2 (two) times daily as needed (itching, swelling). 11/14/16   Venissa Nappi, Chase Picket, PA-C  hyoscyamine (LEVSIN SL) 0.125 MG SL tablet Place 1 tablet (0.125 mg total) under the tongue every 4 (four) hours as needed. 10/04/16   Unk Lightning, PA  ibuprofen (ADVIL,MOTRIN) 200 MG tablet Take 600 mg by mouth every 6 (six) hours as needed.    [provider]  methocarbamol (ROBAXIN) 500 MG tablet Take 1 tablet (500 mg total) by mouth at bedtime as needed for muscle spasms. 04/08/16   Georgiana Shore, PA-C  omeprazole (PRILOSEC) 20 MG capsule Take 1 capsule (20 mg total) by mouth daily. 10/04/16   Unk Lightning, PA  predniSONE (DELTASONE) 20 MG tablet Take 2 tablets (40 mg total) by mouth daily. 11/14/16   Khameron Gruenwald, Chase Picket, PA-C    Family History Family History  Problem Relation Age of Onset  . Irritable bowel syndrome Father   .  Colon polyps Father   . Celiac disease Sister     Social History Social History  Substance Use Topics  . Smoking status: Never Smoker  . Smokeless tobacco: Never Used  . Alcohol use Yes     Allergies   Patient has no known allergies.   Review of Systems Review of Systems  Constitutional: Negative for chills and fever.  Respiratory: Negative for shortness of breath.   Genitourinary: Negative for difficulty urinating, dysuria, frequency, genital sores, urgency, vaginal bleeding and vaginal discharge.       + vaginal swelling  All other systems reviewed and are negative.    Physical Exam Updated  Vital Signs BP 131/88 (BP Location: Right Arm)   Pulse 92   Temp 98.4 F (36.9 C) (Oral)   Resp 18   Ht 5\' 2"  (1.575 m)   Wt 57.6 kg (127 lb)   LMP 08/17/2016 (Within Weeks)   SpO2 100%   BMI 23.23 kg/m   Physical Exam  Constitutional: She is oriented to person, place, and time. She appears well-developed and well-nourished. No distress.  HENT:  Head: Normocephalic and atraumatic.  No oral lesions. Airway patent.  Cardiovascular: Normal rate, regular rhythm and normal heart sounds.   No murmur heard. Pulmonary/Chest: Effort normal and breath sounds normal. No respiratory distress.  Abdominal: Soft. She exhibits no distension. There is no tenderness.  Genitourinary:  Genitourinary Comments: Swelling and tenderness to clitoris and labia minora. No genital lesions appreciated. No erythema or warmth.   Neurological: She is alert and oriented to person, place, and time.  Skin: Skin is warm and dry.  1 cm area of erythema with surrounding scaling to right upper arm. No tenderness to palpation. No warmth.   Nursing note and vitals reviewed.    ED Treatments / Results  Labs (all labs ordered are listed, but only abnormal results are displayed) Labs Reviewed - No data to display  EKG  EKG Interpretation None       Radiology No results found.  Procedures Procedures (including critical care time)  Medications Ordered in ED Medications  famotidine (PEPCID) tablet 40 mg (not administered)  predniSONE (DELTASONE) tablet 40 mg (not administered)     Initial Impression / Assessment and Plan / ED Course  I have reviewed the triage vital signs and the nursing notes.  Pertinent labs & imaging results that were available during my care of the patient were reviewed by me and considered in my medical decision making (see chart for details).    Harrold DonathCarissa Ramakrishnan is a 21 y.o. female who presents to ED for swelling to clitoris and labia minora which began this morning. No oral or  genital lesions on exam. No urinary symptoms or vaginal discharge. She has been staying with a friend for the last 2-3 days and used their soap over this time frame. She also used their luffa last night. Exam c/w allergic reaction. Will treat with steroids, pepcid, benadryl. Follow up with women's health if symptoms persist.   Patient additional complaining of 1cm lesion to right upper arm which has been present for 2-3 weeks c/w fungal infection. Rx for topical cream given. PCP follow up if no improvement.   Return precautions and plan of care discussed with patient. All questions answered.    Patient discussed with Dr. Clayborne DanaMesner who agrees with treatment plan.    Final Clinical Impressions(s) / ED Diagnoses   Final diagnoses:  Allergic reaction, initial encounter    New Prescriptions New Prescriptions  CLOTRIMAZOLE (LOTRIMIN) 1 % CREAM    Apply to affected area on arm 2 times daily   FAMOTIDINE (PEPCID) 20 MG TABLET    Take 1 tablet (20 mg total) by mouth 2 (two) times daily as needed (itching, swelling).   PREDNISONE (DELTASONE) 20 MG TABLET    Take 2 tablets (40 mg total) by mouth daily.     Aaradhya Kysar, Chase Picket, PA-C 11/14/16 1041    Mesner, Barbara Cower, MD 11/15/16 [phone]

## 2016-11-14 NOTE — Discharge Instructions (Signed)
It was my pleasure taking care of you today!  Take steroids as directed until completion. Pepcid as needed for itching/swelling.  Benadryl as needed for itching.   Follow up with your doctor or [facility] listed if symptoms are not improving after 3 days. You may return to the emergency department if symptoms worsen, become progressive, or become more concerning.  SEEK IMMEDIATE MEDICAL CARE IF:  You develop difficulty breathing or wheezing, or have a tight feeling in your chest or throat (feeling like your throat is closing) You develop swollen lips or tongue You develop hives on your chest, neck or face. You are unable to swallow fluids or salvia secretions.   A severe reaction with any of the above problems should be considered life-threatening. If you suddenly develop difficulty breathing call for local emergency medical help. THIS IS AN EMERGENCY.

## 2016-11-14 NOTE — ED Triage Notes (Signed)
Per Pt, Pt is coming from home with complaints of vaginal swelling that started this morning. Reports that she woke up with some itching and after she went to the bathroom she noted some swelling to the groin area. Pt also noted to have a circular rash noted to her right arm.

## 2018-02-27 ENCOUNTER — Emergency Department (HOSPITAL_COMMUNITY)
Admission: EM | Admit: 2018-02-27 | Discharge: 2018-02-28 | Disposition: A | Discharge status: Home / Self Care | Payer: Commercial Managed Care - PPO | Attending: Emergency Medicine | Admitting: Emergency Medicine

## 2018-02-27 ENCOUNTER — Encounter (HOSPITAL_COMMUNITY): Payer: Self-pay | Admitting: Emergency Medicine

## 2018-02-27 DIAGNOSIS — S0181XA Laceration without foreign body of other part of head, initial encounter: Secondary | ICD-10-CM

## 2018-02-27 DIAGNOSIS — Y92009 Unspecified place in unspecified non-institutional (private) residence as the place of occurrence of the external cause: Secondary | ICD-10-CM | POA: Insufficient documentation

## 2018-02-27 DIAGNOSIS — Z23 Encounter for immunization: Secondary | ICD-10-CM | POA: Insufficient documentation

## 2018-02-27 DIAGNOSIS — S01511A Laceration without foreign body of lip, initial encounter: Secondary | ICD-10-CM | POA: Insufficient documentation

## 2018-02-27 DIAGNOSIS — W5501XA Bitten by cat, initial encounter: Secondary | ICD-10-CM | POA: Insufficient documentation

## 2018-02-27 DIAGNOSIS — Y999 Unspecified external cause status: Secondary | ICD-10-CM | POA: Insufficient documentation

## 2018-02-27 DIAGNOSIS — Z79899 Other long term (current) drug therapy: Secondary | ICD-10-CM | POA: Insufficient documentation

## 2018-02-27 DIAGNOSIS — Y9389 Activity, other specified: Secondary | ICD-10-CM | POA: Insufficient documentation

## 2018-02-27 MED ORDER — ACETAMINOPHEN 325 MG PO TABS
650.0000 mg | ORAL_TABLET | Freq: Once | ORAL | Status: AC
  Administered 2018-02-27: 650 mg via ORAL
  Filled 2018-02-27: qty 2

## 2018-02-27 MED ORDER — TETANUS-DIPHTH-ACELL PERTUSSIS 5-2.5-18.5 LF-MCG/0.5 IM SUSP
0.5000 mL | Freq: Once | INTRAMUSCULAR | Status: AC
  Administered 2018-02-27: 0.5 mL via INTRAMUSCULAR
  Filled 2018-02-27: qty 0.5

## 2018-02-27 MED ORDER — AMOXICILLIN-POT CLAVULANATE 875-125 MG PO TABS
1.0000 | ORAL_TABLET | Freq: Once | ORAL | Status: AC
  Administered 2018-02-27: 1 via ORAL
  Filled 2018-02-27: qty 1

## 2018-02-27 MED ORDER — BUPIVACAINE HCL (PF) 0.5 % IJ SOLN
5.0000 mL | Freq: Once | INTRAMUSCULAR | Status: AC
  Administered 2018-02-27: 5 mL
  Filled 2018-02-27: qty 10

## 2018-02-27 NOTE — ED Provider Notes (Signed)
[facility] EMERGENCY DEPARTMENT Provider Note   CSN: [id number] Arrival date & time: 02/27/18  2116     History   Chief Complaint Chief Complaint  Patient presents with  . Animal Bite    HPI Sheila Bradley is a 23 y.o. female.  23 year old female presents to the emergency department for evaluation of laceration sustained from reported cat bite.  Patient states that she was playing with her cat when the cat bit her upper lip.  Notes separation of prior well-healed laceration.  Initial laceration injury sustained when playing softball.  No swelling, drainage, redness.  Complains of some constant soreness to the area which is worse with lip movement.  She has not taken any medications for her symptoms prior to arrival.  States that her cat is up-to-date with its vaccines.  She cannot recall the date of her last tetanus shot.    The history is provided by the patient. No language interpreter was used.  Animal Bite    Past Medical History:  Diagnosis Date  . Anxiety   . Depression   . Lactose intolerance     There are no active problems to display for this patient.   Past Surgical History:  Procedure Laterality Date  . APPENDECTOMY  2009     OB History   No obstetric history on file.      Home Medications    Prior to Admission medications   Medication Sig Start Date End Date Taking? Authorizing Provider  amoxicillin-clavulanate (AUGMENTIN) 875-125 MG tablet Take 1 tablet by mouth every 12 (twelve) hours. 02/28/18   Antony Madura, PA-C  ASHLYNA 0.15-0.03 &0.01 MG tablet Take 1 tablet by mouth daily. 01/15/16   [provider]  bisacodyl (DULCOLAX) 5 MG EC tablet Take 1 tablet (5 mg total) by mouth daily. 10/01/16   Gerhard Munch, MD  clotrimazole (LOTRIMIN) 1 % cream Apply to affected area on arm 2 times daily 11/14/16   Ward, Chase Picket, PA-C  docusate sodium (COLACE) 250 MG capsule Take 1 capsule (250 mg total) by mouth daily. 10/01/16    Gerhard Munch, MD  famotidine (PEPCID) 20 MG tablet Take 1 tablet (20 mg total) by mouth 2 (two) times daily as needed (itching, swelling). 11/14/16   Ward, Chase Picket, PA-C  hyoscyamine (LEVSIN SL) 0.125 MG SL tablet Place 1 tablet (0.125 mg total) under the tongue every 4 (four) hours as needed. 10/04/16   Unk Lightning, PA  ibuprofen (ADVIL,MOTRIN) 200 MG tablet Take 600 mg by mouth every 6 (six) hours as needed.    [provider]  methocarbamol (ROBAXIN) 500 MG tablet Take 1 tablet (500 mg total) by mouth at bedtime as needed for muscle spasms. 04/08/16   Georgiana Shore, PA-C  omeprazole (PRILOSEC) 20 MG capsule Take 1 capsule (20 mg total) by mouth daily. 10/04/16   Unk Lightning, PA  predniSONE (DELTASONE) 20 MG tablet Take 2 tablets (40 mg total) by mouth daily. 11/14/16   Ward, Chase Picket, PA-C    Family History Family History  Problem Relation Age of Onset  . Irritable bowel syndrome Father   . Colon polyps Father   . Celiac disease Sister     Social History Social History   Tobacco Use  . Smoking status: Never Smoker  . Smokeless tobacco: Never Used  Substance Use Topics  . Alcohol use: Yes  . Drug use: No     Allergies   Patient has no known allergies.  Review of Systems Review of Systems Ten systems reviewed and are negative for acute change, except as noted in the HPI.    Physical Exam Updated Vital Signs BP 121/80 (BP Location: Right Arm)   Pulse 77   Temp 98.2 F (36.8 C) (Oral)   Resp 17   LMP 02/18/2018 (Approximate)   SpO2 100%   Physical Exam Vitals signs and nursing note reviewed.  Constitutional:      General: She is not in acute distress.    Appearance: She is well-developed. She is not diaphoretic.     Comments: Nontoxic appearing and in NAD  HENT:     Head: Normocephalic and atraumatic.     Mouth/Throat:      Comments: Linear vertical laceration to the left upper lip which terminates at the  vermilion border. No drainage, erythema. Gapes slightly with lip movement, but approximates well at rest. Eyes:     General: No scleral icterus.    Conjunctiva/sclera: Conjunctivae normal.  Neck:     Musculoskeletal: Normal range of motion.  Pulmonary:     Effort: Pulmonary effort is normal. No respiratory distress.     Comments: Respirations even and unlabored Musculoskeletal: Normal range of motion.  Skin:    General: Skin is warm and dry.     Coloration: Skin is not pale.     Findings: No erythema or rash.  Neurological:     Mental Status: She is alert and oriented to person, place, and time.  Psychiatric:        Behavior: Behavior normal.      ED Treatments / Results  Labs (all labs ordered are listed, but only abnormal results are displayed) Labs Reviewed - No data to display  EKG None  Radiology No results found.  Procedures Procedures (including critical care time)  LACERATION REPAIR Performed by: Antony Madura Authorized by: Antony Madura Consent: Verbal consent obtained. Risks and benefits: risks, benefits and alternatives were discussed Consent given by: patient Patient identity confirmed: provided demographic data Prepped and Draped in normal sterile fashion Wound explored  Laceration Location: left upper lip  Laceration Length: 1cm  No Foreign Bodies seen or palpated  Anesthesia: local infiltration  Local anesthetic: marcaine 0.5% without epinephrine  Anesthetic total: 2 ml  Irrigation method: syringe Amount of cleaning: standard  Skin closure: 6-0 chromic  Number of sutures: 2  Technique: simple interrupted  Patient tolerance: Patient tolerated the procedure well with no immediate complications.   Medications Ordered in ED Medications  acetaminophen (TYLENOL) tablet 650 mg (650 mg Oral Given 02/27/18 2159)  bupivacaine (MARCAINE) 0.5 % injection 5 mL (5 mLs Infiltration Given 02/27/18 2348)  amoxicillin-clavulanate (AUGMENTIN) 875-125  MG per tablet 1 tablet (1 tablet Oral Given 02/27/18 2348)  Tdap (BOOSTRIX) injection 0.5 mL (0.5 mLs Intramuscular Given 02/27/18 2348)     Initial Impression / Assessment and Plan / ED Course  I have reviewed the triage vital signs and the nursing notes.  Pertinent labs & imaging results that were available during my care of the patient were reviewed by me and considered in my medical decision making (see chart for details).     Patient presents with laceration to face, terminating at the vermilion border, after being bitten by a cat. Tdap booster given. Pressure irrigation performed with 50cc of sterile water. Laceration occurred < 8 hours prior to repair which was well tolerated. Discussed risks of infection prior to repair which patient acknowledged; agreeable to proceed. Started on Augmentin in the ED  and will continue x 7 days. Pt has no comorbidities to effect normal wound healing. Discussed suture home care with pt and answered questions.  Patient to follow-up for wound recheck as needed. Patient is hemodynamically stable with no complaints prior to discharge.     Final Clinical Impressions(s) / ED Diagnoses   Final diagnoses:  Facial laceration, initial encounter  Cat bite, initial encounter    ED Discharge Orders         Ordered    amoxicillin-clavulanate (AUGMENTIN) 875-125 MG tablet  Every 12 hours     02/28/18 0031           Antony MaduraHumes, Mikaelyn Arthurs, PA-C 02/28/18 0040    Zadie RhineWickline, Donald, MD 03/01/18 [phone]

## 2018-02-27 NOTE — ED Triage Notes (Signed)
Patient was accidentally bit on upper lip by her cat.  Cat is up to date with rabies vaccines.  Bleeding controlled.

## 2018-02-28 MED ORDER — AMOXICILLIN-POT CLAVULANATE 875-125 MG PO TABS: 1.0000 | ORAL_TABLET | Freq: Two times a day (BID) | ORAL | 0 refills | Status: AC

## 2018-02-28 NOTE — Discharge Instructions (Signed)
Take Augmentin as prescribed until finished.  Do not miss any doses.  We advise 600 mg ibuprofen every 6 hours for management of any pain.  Apply topical icing 3-4 times per day to limit swelling.  You may apply topical bacitracin to prevent infection.  After 1 week, use vitamin E oil to limit scarring.  Your stitches will dissolve on their own and do not need to be removed.  You can shower normally, but do not soak your head in water such as while swimming.

## 2018-09-04 IMAGING — CT CT ABD-PELV W/ CM
2 of 4 series · 17 of 46 positions shown, 19 images · IV contrast (isovue)
Comparison: None.

CLINICAL DATA: Lower abdominal pain for 3 days. On and off vaginal
discharge.

EXAM:
CT ABDOMEN AND PELVIS WITH CONTRAST
TECHNIQUE: Multidetector CT imaging of the abdomen and pelvis was performed
using the standard protocol following bolus administration of
intravenous contrast.
CONTRAST:  100 cc Isovue 300 intravenous.

[Series 3: a/p w/ 5mm · axial · 0.70mm/px · z∈[+585,+980]mm · 14 of 87 slices shown, 16 images]
[im 4/87  soft-tissue]
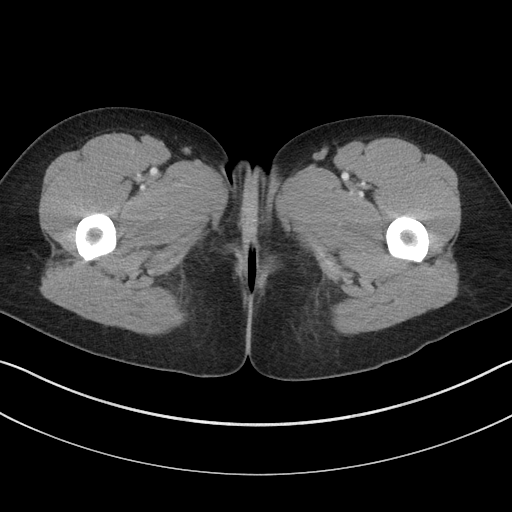
[im 4/87  bone]
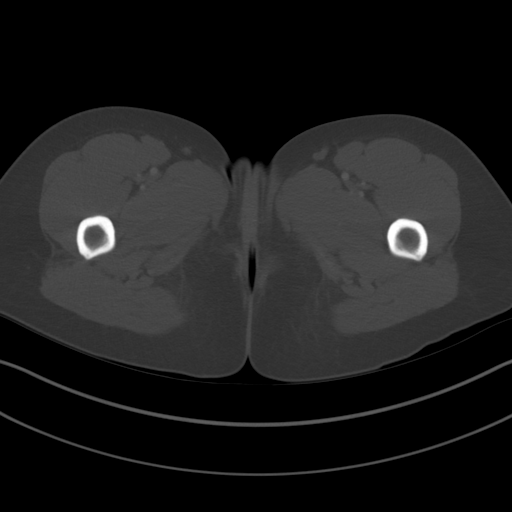
[im 11/87  soft-tissue]
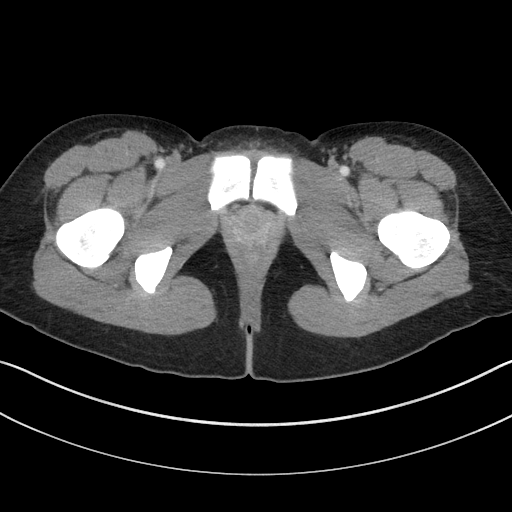
[im 18/87  soft-tissue]
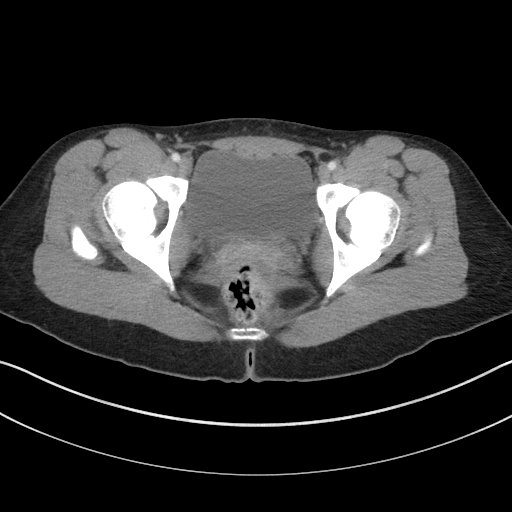
[im 22/87  soft-tissue]
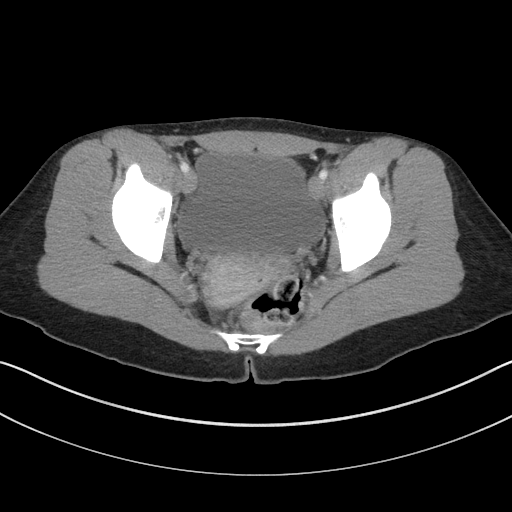
[im 29/87  soft-tissue]
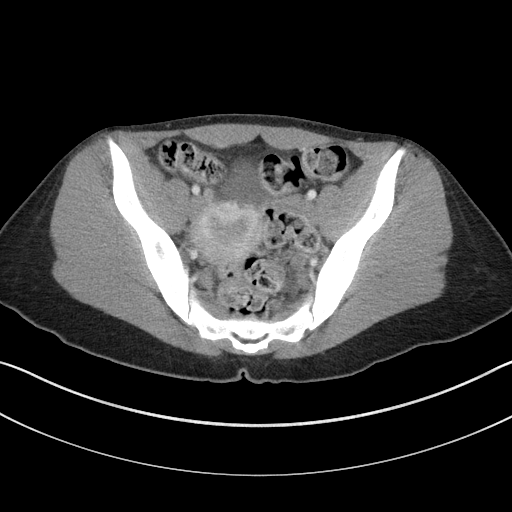
[im 36/87  soft-tissue]
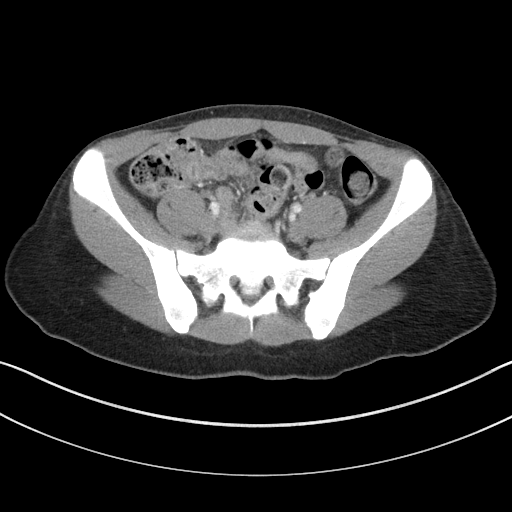
[im 40/87  soft-tissue]
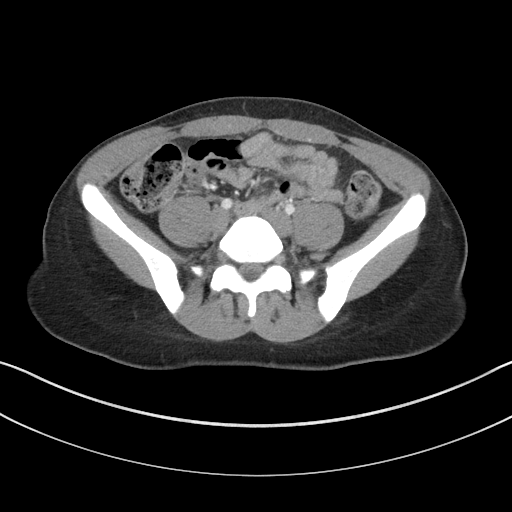
[im 47/87  soft-tissue]
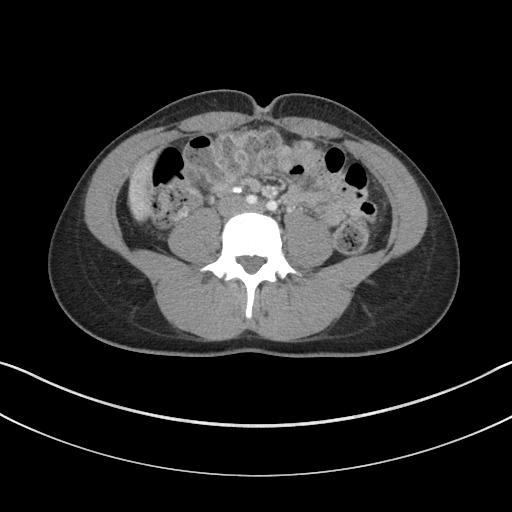
[im 51/87  soft-tissue]
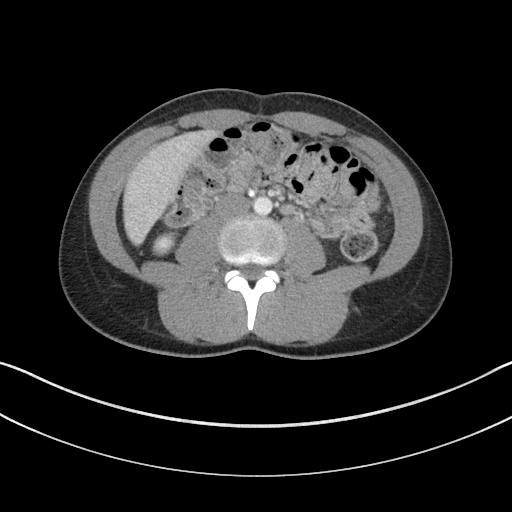
[im 51/87  bone]
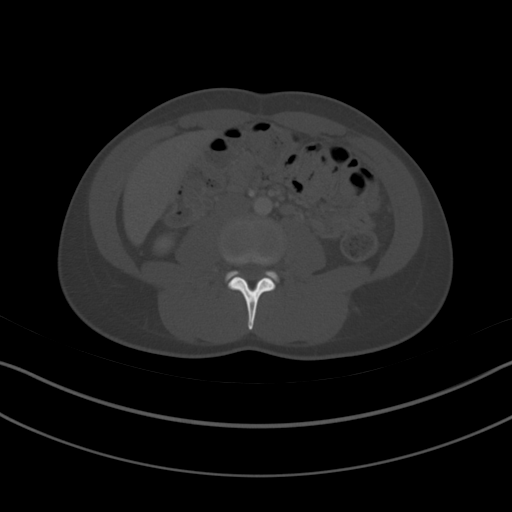
[im 58/87  soft-tissue]
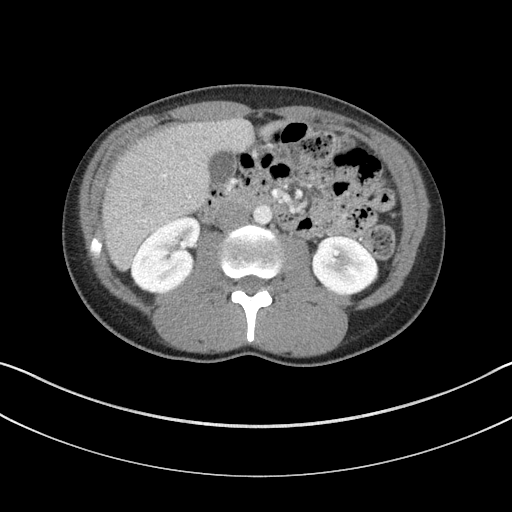
[im 65/87  soft-tissue]
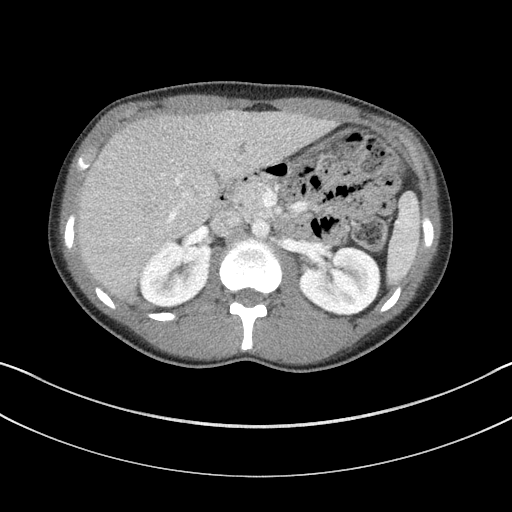
[im 69/87  soft-tissue]
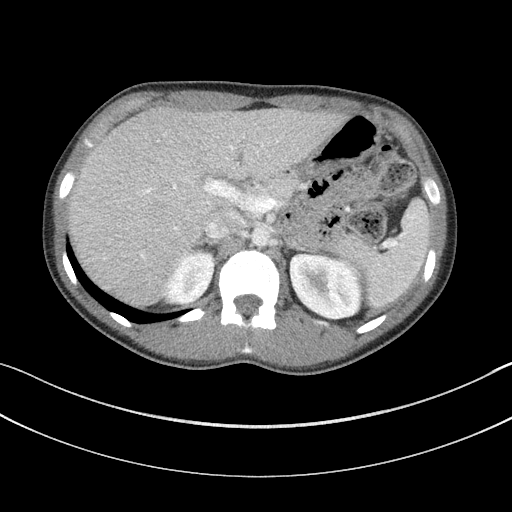
[im 76/87  soft-tissue]
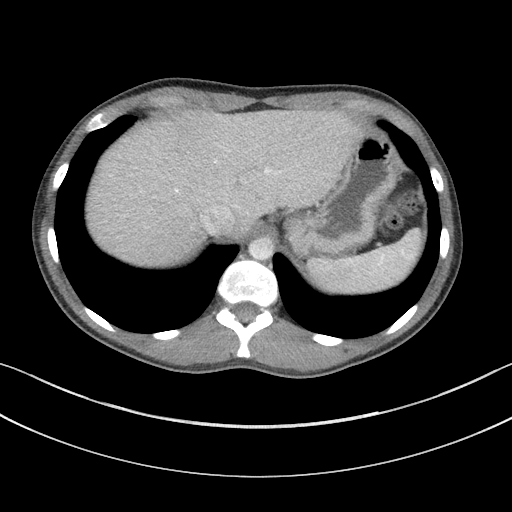
[im 83/87  soft-tissue]
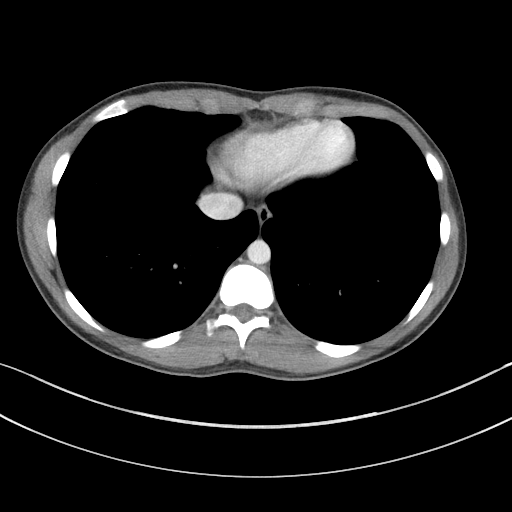

[Series 6: a/p w/ cor · coronal · 0.71mm/px · 3 of 112 slices shown]
[im 38/112  soft-tissue]
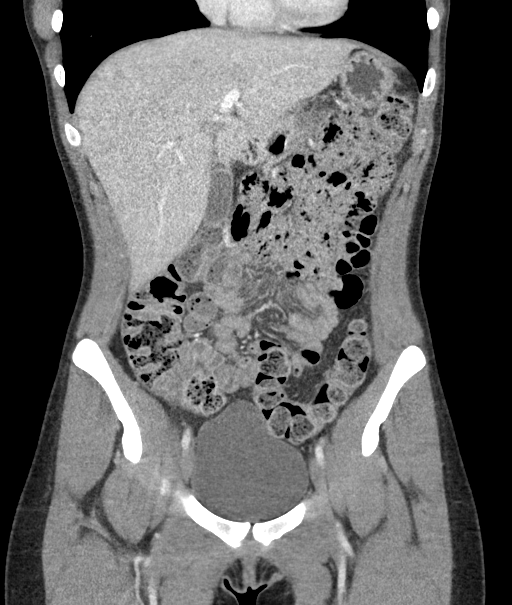
[im 50/112  soft-tissue]
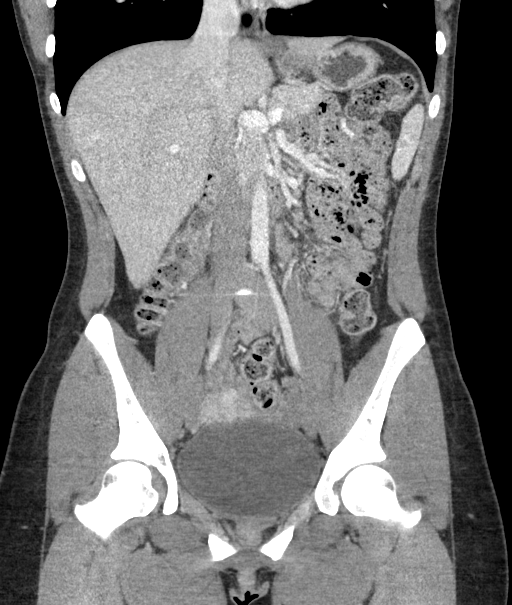
[im 62/112  soft-tissue]
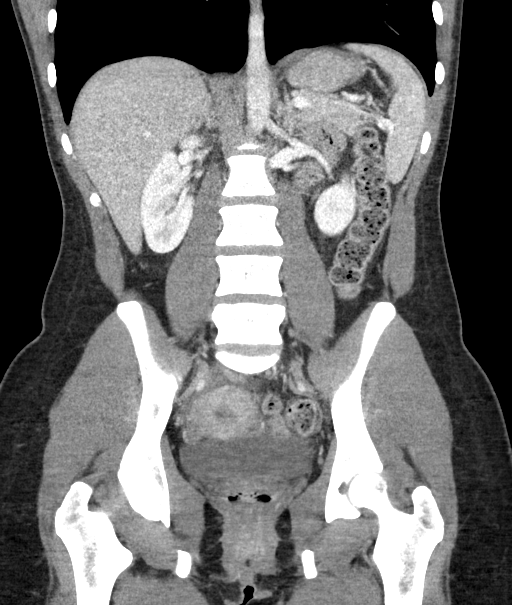

[17 of 46 positions shown; findings below may reference images not displayed]

FINDINGS: Lower chest:  No contributory findings.

Hepatobiliary: No focal liver abnormality.No evidence of biliary
obstruction or stone.

Pancreas: Unremarkable.

Spleen: Unremarkable.

Adrenals/Urinary Tract: Negative adrenals. No hydronephrosis or
stone. Tiny lower pole cyst on the right, simple appearing on
reformats. Unremarkable bladder.

Stomach/Bowel: Bowel loops are difficult to separate from one
another about the cecum; patient has history of appendectomy per the
EMR. No bowel inflammatory changes. Formed stool throughout the
colon, possible constipation.

Vascular/Lymphatic: No vascular abnormality.  No mass or adenopathy.

Reproductive:No pathologic findings.

Other: No ascites or pneumoperitoneum.

Musculoskeletal: No acute abnormalities.
IMPRESSION: 1. No acute finding.
2. Formed stool throughout the colon, please correlate for
constipation.
